# Patient Record
Sex: Female | Born: 1965 | Race: Black or African American | Hispanic: No | Marital: Single
Health system: Southern US, Community
[De-identification: ages and names within clinical notes are randomized; demographics above are authoritative.]

---

## 2019-11-20 ENCOUNTER — Other Ambulatory Visit: Payer: Self-pay

## 2019-11-20 ENCOUNTER — Emergency Department
Admission: EM | Admit: 2019-11-20 | Discharge: 2019-11-20 | Disposition: A | Discharge status: Home / Self Care | Payer: BC Managed Care – PPO | Attending: Emergency Medicine | Admitting: Emergency Medicine

## 2019-11-20 ENCOUNTER — Encounter: Payer: Self-pay | Admitting: Emergency Medicine

## 2019-11-20 ENCOUNTER — Emergency Department: Payer: BC Managed Care – PPO

## 2019-11-20 DIAGNOSIS — M7918 Myalgia, other site: Secondary | ICD-10-CM

## 2019-11-20 DIAGNOSIS — R0789 Other chest pain: Secondary | ICD-10-CM | POA: Insufficient documentation

## 2019-11-20 DIAGNOSIS — M79644 Pain in right finger(s): Secondary | ICD-10-CM | POA: Diagnosis present

## 2019-11-20 DIAGNOSIS — M791 Myalgia, unspecified site: Secondary | ICD-10-CM | POA: Insufficient documentation

## 2019-11-20 MED ORDER — IBUPROFEN 600 MG PO TABS
600.0000 mg | ORAL_TABLET | Freq: Once | ORAL | Status: AC
  Administered 2019-11-20: 600 mg via ORAL
  Filled 2019-11-20: qty 1

## 2019-11-20 MED ORDER — TRAMADOL HCL 50 MG PO TABS
50.0000 mg | ORAL_TABLET | Freq: Once | ORAL | Status: AC
  Administered 2019-11-20: 50 mg via ORAL
  Filled 2019-11-20: qty 1

## 2019-11-20 MED ORDER — CYCLOBENZAPRINE HCL 10 MG PO TABS
10.0000 mg | ORAL_TABLET | Freq: Once | ORAL | Status: AC
  Administered 2019-11-20: 10 mg via ORAL
  Filled 2019-11-20: qty 1

## 2019-11-20 MED ORDER — IBUPROFEN 600 MG PO TABS: 600.0000 mg | ORAL_TABLET | Freq: Three times a day (TID) | ORAL | 0 refills | Status: AC | PRN

## 2019-11-20 MED ORDER — TRAMADOL HCL 50 MG PO TABS: 50.0000 mg | ORAL_TABLET | Freq: Four times a day (QID) | ORAL | 0 refills | Status: AC | PRN

## 2019-11-20 MED ORDER — CYCLOBENZAPRINE HCL 10 MG PO TABS: 10.0000 mg | ORAL_TABLET | Freq: Three times a day (TID) | ORAL | 0 refills | Status: AC | PRN

## 2019-11-20 NOTE — Discharge Instructions (Signed)
Follow discharge care instruction take medication as directed. °
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
## 2019-11-20 NOTE — ED Notes (Signed)
Pt confirms ride home at this time.  
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
## 2019-11-20 NOTE — ED Notes (Signed)
Pt denies HA/vision changes at this time. NAD noted. Pt sepak in clear sentences. BIO grip strength.
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
## 2020-01-28 ENCOUNTER — Ambulatory Visit: Payer: BC Managed Care – PPO | Attending: Internal Medicine

## 2020-01-28 DIAGNOSIS — Z23 Encounter for immunization: Secondary | ICD-10-CM

## 2020-01-28 NOTE — Progress Notes (Signed)
   Covid-19 Vaccination Clinic  Name:  [name]    MRN: [id number] DOB: [birth year]  01/28/2020  [name] was observed post Covid-19 immunization for 15 minutes without incidence. She was provided with Vaccine Information Sheet and instruction to access the V-Safe system.   [name] was instructed to call 911 with any severe reactions post vaccine: Marland Kitchen Difficulty breathing  . Swelling of your face and throat  . A fast heartbeat  . A bad rash all over your body  . Dizziness and weakness    Immunizations Administered    Name Date Dose VIS Date Route   Pfizer COVID-19 Vaccine 01/28/2020  9:49 AM 0.3 mL 11/12/2019 Intramuscular   Manufacturer: [employer], [employer]   Lot: [id number]   NDC: [address]-4

## 2020-02-23 ENCOUNTER — Ambulatory Visit: Payer: BC Managed Care – PPO | Attending: Internal Medicine

## 2020-02-23 DIAGNOSIS — Z23 Encounter for immunization: Secondary | ICD-10-CM

## 2020-02-23 NOTE — Progress Notes (Signed)
   Covid-19 Vaccination Clinic  Name:  Julia Mcdonald    MRN: [id number] DOB: [birth year]  02/23/2020  Julia Mcdonald was observed post Covid-19 immunization for 15 minutes without incident. She was provided with Vaccine Information Sheet and instruction to access the V-Safe system.   Julia Mcdonald was instructed to call 911 with any severe reactions post vaccine: Marland Kitchen Difficulty breathing  . Swelling of face and throat  . A fast heartbeat  . A bad rash all over body  . Dizziness and weakness   Immunizations Administered    Name Date Dose VIS Date Route   Pfizer COVID-19 Vaccine 02/23/2020  4:44 PM 0.3 mL 11/12/2019 Intramuscular   Manufacturer: [employer], [employer]   Lot: [id number]   NDC: [address]-2
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
## 2023-09-04 DIAGNOSIS — Z23 Encounter for immunization: Secondary | ICD-10-CM | POA: Diagnosis not present
